# Patient Record
Sex: Male | Born: 1960 | Race: Black or African American | Hispanic: No | Marital: Married
Health system: Southern US, Community
[De-identification: ages and names within clinical notes are randomized; demographics above are authoritative.]

---

## 2014-01-13 ENCOUNTER — Other Ambulatory Visit: Payer: Self-pay | Admitting: Occupational Medicine

## 2014-01-13 ENCOUNTER — Ambulatory Visit: Payer: Self-pay

## 2014-01-13 DIAGNOSIS — M25512 Pain in left shoulder: Secondary | ICD-10-CM

## 2016-02-28 DIAGNOSIS — R05 Cough: Secondary | ICD-10-CM | POA: Diagnosis not present

## 2016-02-28 DIAGNOSIS — R509 Fever, unspecified: Secondary | ICD-10-CM | POA: Diagnosis not present

## 2016-07-13 DIAGNOSIS — R7301 Impaired fasting glucose: Secondary | ICD-10-CM | POA: Diagnosis not present

## 2016-07-13 DIAGNOSIS — E782 Mixed hyperlipidemia: Secondary | ICD-10-CM | POA: Diagnosis not present

## 2016-07-13 DIAGNOSIS — Z0000 Encounter for general adult medical examination without abnormal findings: Secondary | ICD-10-CM | POA: Diagnosis not present

## 2016-07-13 DIAGNOSIS — Z125 Encounter for screening for malignant neoplasm of prostate: Secondary | ICD-10-CM | POA: Diagnosis not present

## 2016-07-13 DIAGNOSIS — I499 Cardiac arrhythmia, unspecified: Secondary | ICD-10-CM | POA: Diagnosis not present

## 2016-12-26 DIAGNOSIS — R739 Hyperglycemia, unspecified: Secondary | ICD-10-CM | POA: Diagnosis not present

## 2016-12-26 DIAGNOSIS — E119 Type 2 diabetes mellitus without complications: Secondary | ICD-10-CM | POA: Diagnosis not present

## 2016-12-26 DIAGNOSIS — Z7984 Long term (current) use of oral hypoglycemic drugs: Secondary | ICD-10-CM | POA: Diagnosis not present

## 2017-01-19 DIAGNOSIS — J069 Acute upper respiratory infection, unspecified: Secondary | ICD-10-CM | POA: Diagnosis not present

## 2017-07-31 DIAGNOSIS — Z125 Encounter for screening for malignant neoplasm of prostate: Secondary | ICD-10-CM | POA: Diagnosis not present

## 2017-07-31 DIAGNOSIS — E782 Mixed hyperlipidemia: Secondary | ICD-10-CM | POA: Diagnosis not present

## 2017-07-31 DIAGNOSIS — Z0000 Encounter for general adult medical examination without abnormal findings: Secondary | ICD-10-CM | POA: Diagnosis not present

## 2017-07-31 DIAGNOSIS — E119 Type 2 diabetes mellitus without complications: Secondary | ICD-10-CM | POA: Diagnosis not present

## 2017-10-25 DIAGNOSIS — A084 Viral intestinal infection, unspecified: Secondary | ICD-10-CM | POA: Diagnosis not present

## 2018-01-31 DIAGNOSIS — Z7984 Long term (current) use of oral hypoglycemic drugs: Secondary | ICD-10-CM | POA: Diagnosis not present

## 2018-01-31 DIAGNOSIS — E119 Type 2 diabetes mellitus without complications: Secondary | ICD-10-CM | POA: Diagnosis not present

## 2019-03-04 ENCOUNTER — Encounter: Payer: Self-pay | Admitting: Family Medicine

## 2019-03-04 ENCOUNTER — Other Ambulatory Visit: Payer: Self-pay

## 2019-03-04 ENCOUNTER — Ambulatory Visit (INDEPENDENT_AMBULATORY_CARE_PROVIDER_SITE_OTHER): Payer: Commercial Managed Care - PPO | Admitting: Family Medicine

## 2019-03-04 ENCOUNTER — Ambulatory Visit: Payer: Self-pay

## 2019-03-04 DIAGNOSIS — M25512 Pain in left shoulder: Secondary | ICD-10-CM

## 2019-03-04 MED ORDER — NABUMETONE 750 MG PO TABS: 750.0000 mg | ORAL_TABLET | Freq: Two times a day (BID) | ORAL | 6 refills | Status: AC | PRN

## 2019-03-04 NOTE — Progress Notes (Signed)
   Office Visit Note   Patient: Bryan Goodman           Date of Birth: 04-May-1960           MRN: [id number] Visit Date: 03/04/2019 Requested by: No referring provider defined for this encounter. PCP: Patient, No Pcp Per  Subjective: Chief Complaint  Patient presents with  . Left Shoulder - Pain    Left shoulder pain and decreased ROM x 3 weeks. Did fall backward off a truck, but it did not start hurting until a week to week & 1/2 later. Does do a lot of repetitive lifting on his job. Cannot sleep on left side - will wake him.    HPI: He is here with left shoulder pain.  He is right-hand dominant.  About 3 weeks ago he fell backward off a truck but did not feel pain in his shoulder until about a week later.  He started having pain on the posterior lateral aspect of the shoulder when reaching his arm overhead or behind his back.  He is not taking anything for the pain.  It is starting to keep him awake at night.  He is using a topical liniment with some relief.  History of shoulder problems about 5 years ago, improved with rest.              ROS: No fevers or chills.  All other systems were reviewed and are negative.  Objective: Vital Signs: There were no vitals taken for this visit.  Physical Exam:  General:  Alert and oriented, in no acute distress. Pulm:  Breathing unlabored. Psy:  Normal mood, congruent affect. Skin: No rash. Left shoulder: Full active range of motion with pain reaching overhead and behind the back.  He is tender in the posterior subacromial space.  Isometric rotator cuff strength is 5/5 throughout but he does have some pain with empty can test.  Speeds test is negative.  No tenderness at the [facility] joint.  Imaging: X-rays left shoulder: Moderate AC joint arthropathy and mild glenohumeral inferior spurring but overall bony anatomy is unremarkable.    Assessment & Plan: 1.  Left shoulder pain, suspect subacromial/subdeltoid bursitis. -Discussed treatment options and  elected to inject the subacromial space.  Anti-inflammatories as needed.  Physical therapy if pain persist.     Procedures: Left shoulder subacromial injection: After sterile prep with Betadine, injected 5 cc 1% lidocaine without epinephrine and 40 mg methylprednisolone from posterior approach into the subacromial space.  He had improvement in pain and range of motion during the anesthetic phase.   PMFS History: There are no problems to display for this patient.  History reviewed. No pertinent past medical history.  History reviewed. No pertinent family history.  History reviewed. No pertinent surgical history. Social History   Occupational History  . Not on file  Tobacco Use  . Smoking status: Not on file  Substance and Sexual Activity  . Alcohol use: Not on file  . Drug use: Not on file  . Sexual activity: Not on file

## 2019-03-11 ENCOUNTER — Ambulatory Visit: Payer: Self-pay | Admitting: Orthopaedic Surgery

## 2020-01-13 ENCOUNTER — Ambulatory Visit
Admission: RE | Admit: 2020-01-13 | Discharge: 2020-01-13 | Disposition: A | Discharge status: Home / Self Care | Payer: Commercial Managed Care - PPO | Source: Ambulatory Visit | Attending: Internal Medicine | Admitting: Internal Medicine

## 2020-01-13 ENCOUNTER — Other Ambulatory Visit: Payer: Self-pay | Admitting: Internal Medicine

## 2020-01-13 DIAGNOSIS — M5441 Lumbago with sciatica, right side: Secondary | ICD-10-CM

## 2020-02-03 ENCOUNTER — Other Ambulatory Visit: Payer: Self-pay | Admitting: Internal Medicine

## 2020-02-03 ENCOUNTER — Ambulatory Visit
Admission: RE | Admit: 2020-02-03 | Discharge: 2020-02-03 | Disposition: A | Discharge status: Home / Self Care | Payer: Commercial Managed Care - PPO | Source: Ambulatory Visit | Attending: Internal Medicine | Admitting: Internal Medicine

## 2020-02-03 DIAGNOSIS — M25559 Pain in unspecified hip: Secondary | ICD-10-CM

## 2020-10-14 ENCOUNTER — Ambulatory Visit (INDEPENDENT_AMBULATORY_CARE_PROVIDER_SITE_OTHER): Payer: Commercial Managed Care - PPO | Admitting: Orthopaedic Surgery

## 2020-10-14 ENCOUNTER — Encounter: Payer: Self-pay | Admitting: Orthopaedic Surgery

## 2020-10-14 ENCOUNTER — Other Ambulatory Visit: Payer: Self-pay

## 2020-10-14 ENCOUNTER — Ambulatory Visit: Payer: Self-pay

## 2020-10-14 DIAGNOSIS — G8929 Other chronic pain: Secondary | ICD-10-CM

## 2020-10-14 DIAGNOSIS — M25511 Pain in right shoulder: Secondary | ICD-10-CM

## 2020-10-14 DIAGNOSIS — M7541 Impingement syndrome of right shoulder: Secondary | ICD-10-CM | POA: Diagnosis not present

## 2020-10-14 MED ORDER — BUPIVACAINE HCL 0.25 % IJ SOLN
2.0000 mL | INTRAMUSCULAR | Status: AC | PRN
  Administered 2020-10-14: 2 mL via INTRA_ARTICULAR

## 2020-10-14 MED ORDER — METHYLPREDNISOLONE ACETATE 40 MG/ML IJ SUSP
80.0000 mg | INTRAMUSCULAR | Status: AC | PRN
  Administered 2020-10-14: 80 mg via INTRA_ARTICULAR

## 2020-10-14 MED ORDER — LIDOCAINE HCL 2 % IJ SOLN
2.0000 mL | INTRAMUSCULAR | Status: AC | PRN
  Administered 2020-10-14: 2 mL

## 2020-10-14 NOTE — Progress Notes (Signed)
Office Visit Note   Patient: Bryan Goodman           Date of Birth: 01-14-1961           MRN: [id number] Visit Date: 10/14/2020              Requested by: No referring provider defined for this encounter. PCP: Patient, No Pcp Per (Inactive)   Assessment & Plan: Visit Diagnoses:  1. Chronic right shoulder pain   2. Impingement syndrome of right shoulder     Plan: Mr. Vue relates at least a 6-week history of right shoulder pain.  He thinks that it may have occurred about 6 weeks ago when he slipped off a trailer at work pulled his "right shoulder".  He is having difficulty raising his arm over his head and sleeping.  The pain is localized anteriorly and not associated with numbness or tingling.  He has had some discomfort in that shoulder in the past and Dr. Prince Rome injected it with good relief.  X-rays were nondiagnostic.  He does have evidence of impingement.  We will try a subacromial cortisone injection and if no improvement consider an MRI scan over the next 3 to 4 weeks  Follow-Up Instructions: Return if symptoms worsen or fail to improve.   Orders:  Orders Placed This Encounter  Procedures   XR Shoulder Right   No orders of the defined types were placed in this encounter.     Procedures: Large Joint Inj: R subacromial bursa on 10/14/2020 4:19 PM Indications: pain and diagnostic evaluation Details: 25 G 1.5 in needle, anterolateral approach  Arthrogram: No  Medications: 2 mL lidocaine 2 %; 80 mg methylPREDNISolone acetate 40 MG/ML; 2 mL bupivacaine 0.25 % Consent was given by the patient. Immediately prior to procedure a time out was called to verify the correct patient, procedure, equipment, support staff and site/side marked as required. Patient was prepped and draped in the usual sterile fashion.      Clinical Data: No additional findings.   Subjective: Chief Complaint  Patient presents with   Right Shoulder - Pain  Patient presents today for right shoulder  pain. He said that it has been hurting for "awhile" and worsening. His pain is located anteriorly with decreased range of motion. He has seen Dr.Hilts in the past and had a cortisone injection, which he is not sure whether it helped or not. He is leaving for vacation and hoping to get some relief.  No numbness or tingling.  HPI  Review of Systems   Objective: Vital Signs: There were no vitals taken for this visit.  Physical Exam Constitutional:      Appearance: He is well-developed.  Eyes:     Pupils: Pupils are equal, round, and reactive to light.  Pulmonary:     Effort: Pulmonary effort is normal.  Skin:    General: Skin is warm and dry.  Neurological:     Mental Status: He is alert and oriented to person, place, and time.  Psychiatric:        Behavior: Behavior normal.    Ortho Exam right shoulder with tenderness along the anterior lateral subacromial area with positive impingement and empty can testing.  No popping or clicking.  Was able to abduct about 80 degrees with subacromial pain and only is able to flex about 110 degrees related to pain.  No pain along the biceps.  Biceps was intact.  After subacromial cortisone injection he had almost full overhead motion and abduction  to least 90 degrees.  Specialty Comments:  No specialty comments available.  Imaging: XR Shoulder Right  Result Date: 10/14/2020 Films of the right shoulder obtained in 3 projections.  There are some degenerative changes at the acromioclavicular joint with sclerotic margins on the distal clavicle.  Joint space is narrowed.  Humeral head is centered about the glenoid and there is a normal space between the humeral head and the acromion.  No acute changes.  No ectopic calcification    PMFS History: Patient Active Problem List   Diagnosis Date Noted   Impingement syndrome of right shoulder 10/14/2020   History reviewed. No pertinent past medical history.  History reviewed. No pertinent family history.   History reviewed. No pertinent surgical history. Social History   Occupational History   Not on file  Tobacco Use   Smoking status: Not on file   Smokeless tobacco: Not on file  Substance and Sexual Activity   Alcohol use: Not on file   Drug use: Not on file   Sexual activity: Not on file

## 2021-07-05 IMAGING — DX DG LUMBAR SPINE 2-3V
3 series · 3 of 3 positions shown · non-contrast
Comparison: None.

CLINICAL DATA: Right-sided low back pain with sciatica.

EXAM:
LUMBAR SPINE - 2-3 VIEW

[dg lumbar spine 2-3 views (1 of 3)]
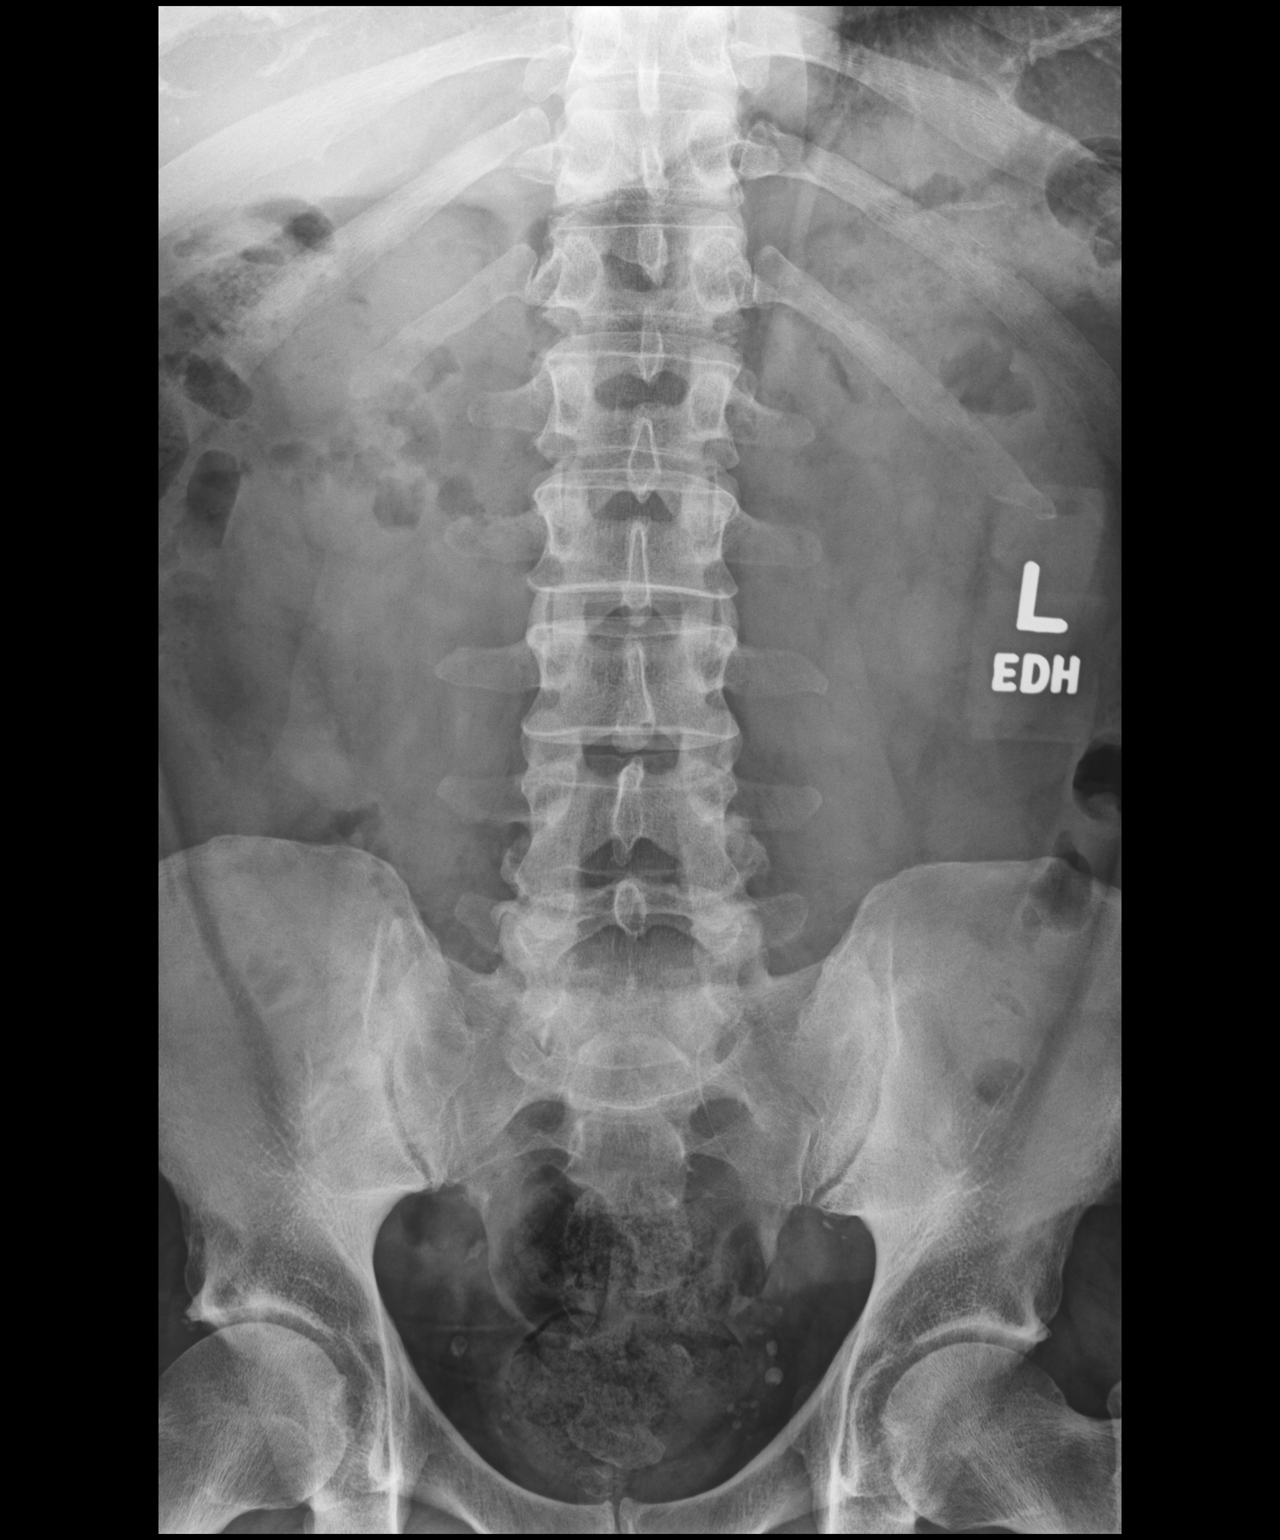

[dg lumbar spine 2-3 views (2 of 3)]
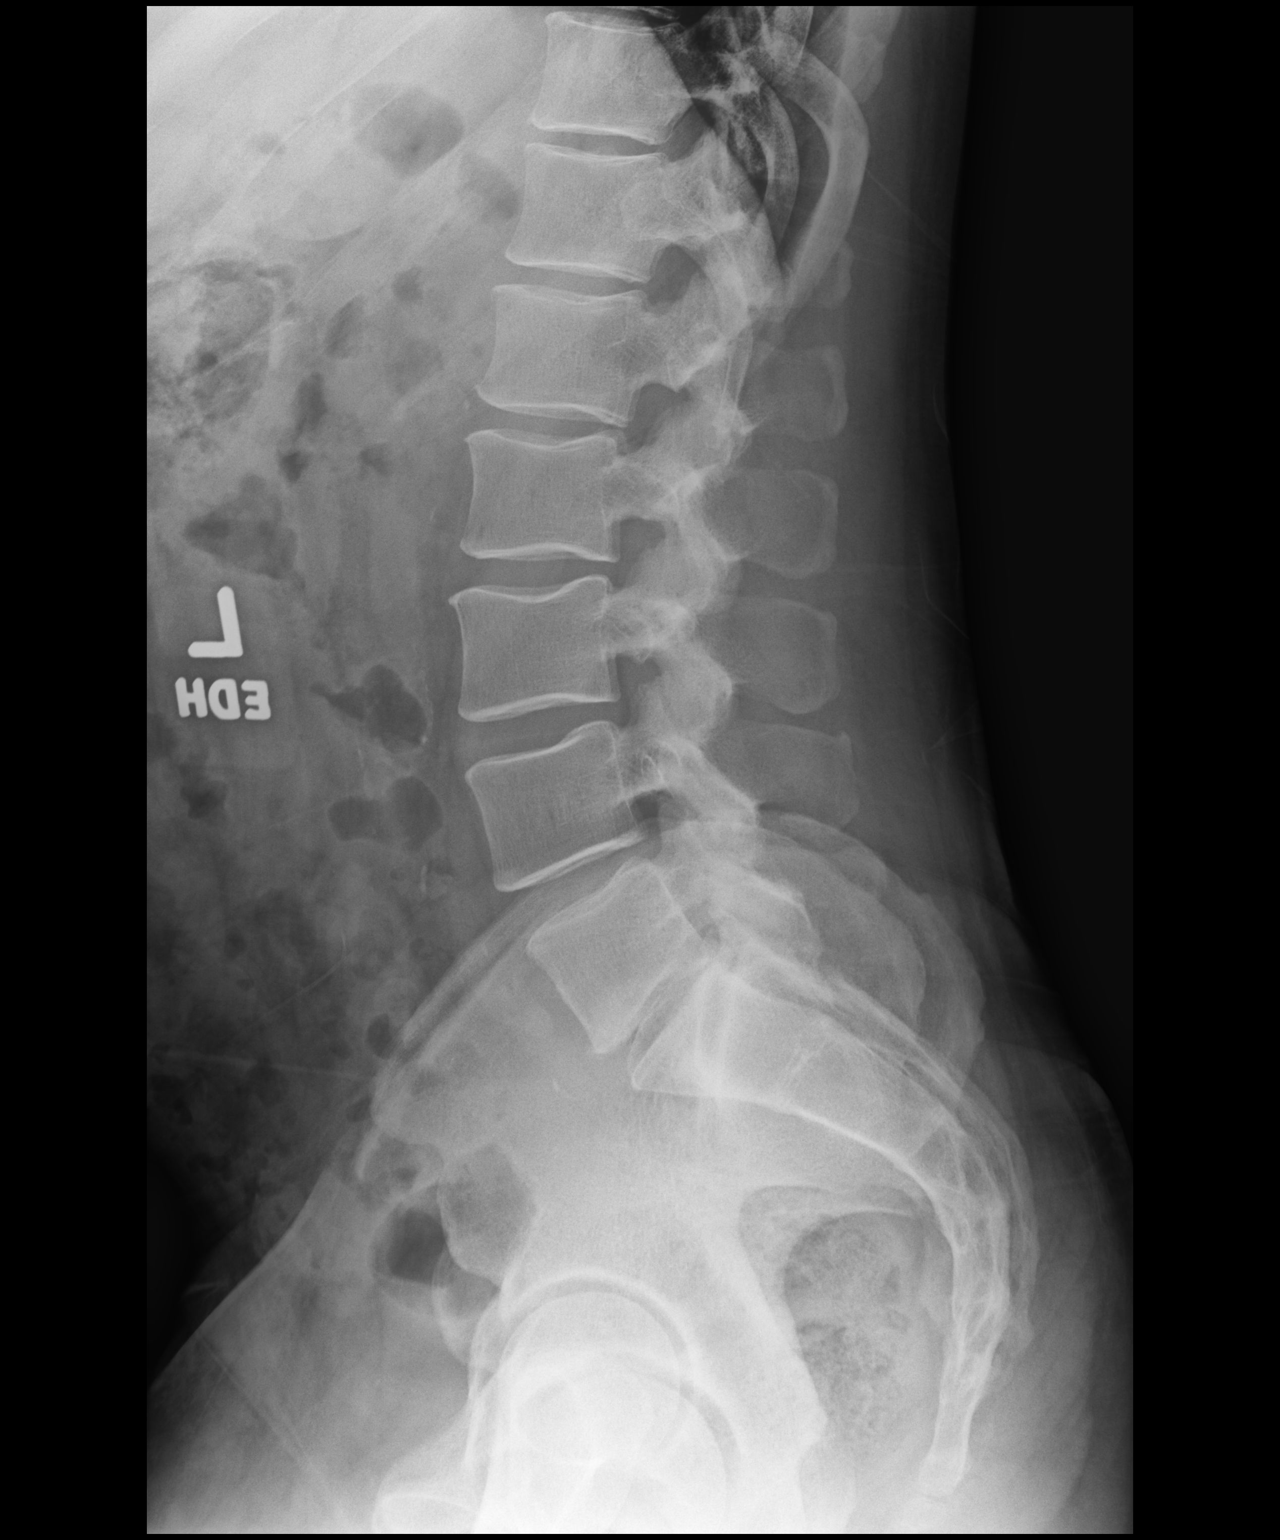

[dg lumbar spine 2-3 views (3 of 3)]
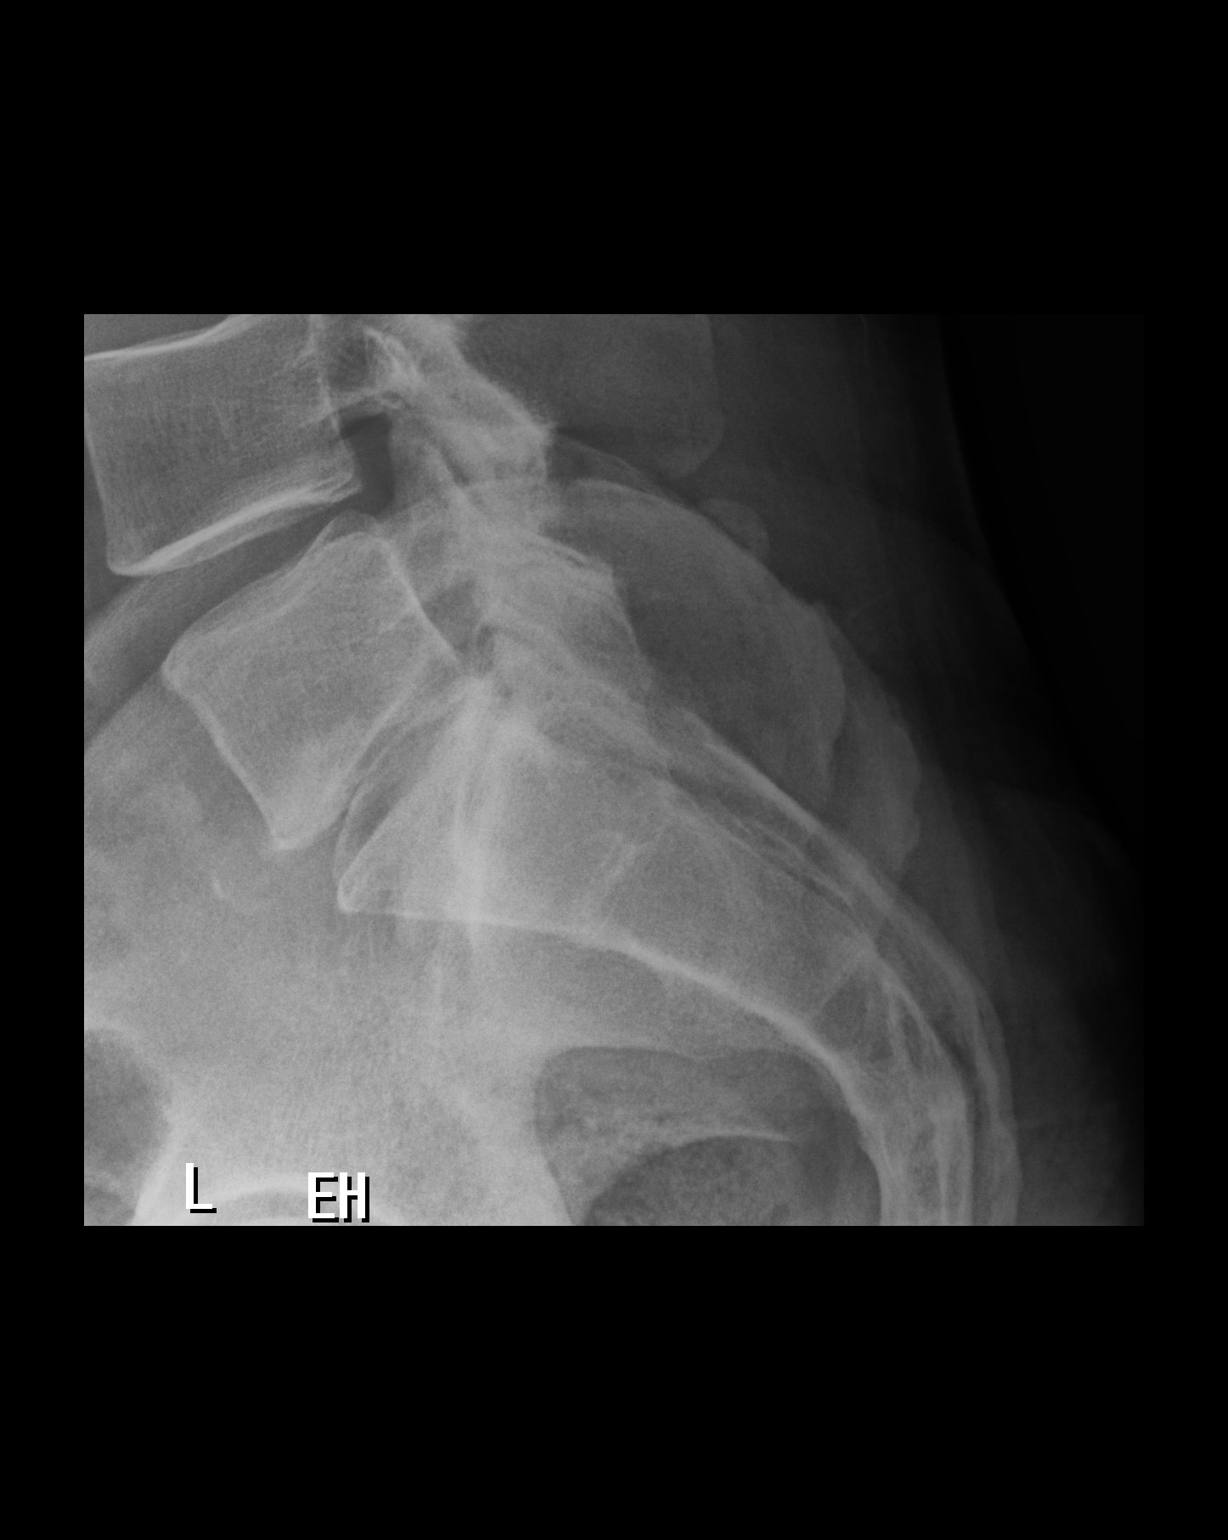

[3 of 3 positions shown; findings below may reference images not displayed]

FINDINGS: There is no acute compression fracture. There is mild-to-moderate
disc height loss at the L5-S1 level. There is mild facet arthrosis
at the lower lumbar segments. There is no significant malalignment.
Vascular calcifications are noted of the abdominal aorta. There are
calcifications that project over the patient's pelvis and are
favored to represent phleboliths.
IMPRESSION: 1. No acute osseous abnormality. Mild degenerative changes of the
lower lumbar spine.
2. Calcifications project over the patient's pelvis and are favored
to represent phleboliths.
